# Patient Record
Sex: Female | Born: 1995 | Race: White | Hispanic: No | Marital: Single | Smoking: Never smoker
Health system: Southern US, Community
[De-identification: ages and names within clinical notes are randomized; demographics above are authoritative.]

## PROBLEM LIST (undated history)

## (undated) DIAGNOSIS — E109 Type 1 diabetes mellitus without complications: Secondary | ICD-10-CM

## (undated) DIAGNOSIS — E119 Type 2 diabetes mellitus without complications: Secondary | ICD-10-CM

---

## 2017-08-05 ENCOUNTER — Emergency Department (HOSPITAL_COMMUNITY): Payer: BLUE CROSS/BLUE SHIELD

## 2017-08-05 ENCOUNTER — Emergency Department (HOSPITAL_COMMUNITY)
Admission: EM | Admit: 2017-08-05 | Discharge: 2017-08-05 | Disposition: A | Discharge status: Home / Self Care | Payer: BLUE CROSS/BLUE SHIELD | Attending: Emergency Medicine | Admitting: Emergency Medicine

## 2017-08-05 ENCOUNTER — Other Ambulatory Visit: Payer: Self-pay

## 2017-08-05 ENCOUNTER — Encounter (HOSPITAL_COMMUNITY): Payer: Self-pay

## 2017-08-05 DIAGNOSIS — R569 Unspecified convulsions: Secondary | ICD-10-CM

## 2017-08-05 DIAGNOSIS — R059 Cough, unspecified: Secondary | ICD-10-CM

## 2017-08-05 DIAGNOSIS — R809 Proteinuria, unspecified: Secondary | ICD-10-CM | POA: Insufficient documentation

## 2017-08-05 DIAGNOSIS — R05 Cough: Secondary | ICD-10-CM

## 2017-08-05 DIAGNOSIS — R739 Hyperglycemia, unspecified: Secondary | ICD-10-CM | POA: Diagnosis not present

## 2017-08-05 HISTORY — DX: Type 2 diabetes mellitus without complications: E11.9

## 2017-08-05 HISTORY — DX: Type 1 diabetes mellitus without complications: E10.9

## 2017-08-05 LAB — I-STAT BETA HCG BLOOD, ED (MC, WL, AP ONLY): I-stat hCG, quantitative: <5 m[IU]/mL (ref <5)

## 2017-08-05 LAB — COMPREHENSIVE METABOLIC PANEL
ALK PHOS: 64 U/L (ref 38–126)
ALT: 21 U/L (ref 14–54)
AST: 39 U/L (ref 15–41)
Albumin: 3.6 g/dL (ref 3.5–5.0)
Anion gap: 15 (ref 5–15)
BILIRUBIN TOTAL: 0.6 mg/dL (ref 0.3–1.2)
BUN: 10 mg/dL (ref 6–20)
CHLORIDE: 104 mmol/L (ref 101–111)
CO2: 17 mmol/L — ABNORMAL LOW (ref 22–32)
Calcium: 8.8 mg/dL — ABNORMAL LOW (ref 8.9–10.3)
Creatinine, Ser: 0.89 mg/dL (ref 0.44–1.00)
GFR calc Af Amer: >60 mL/min (ref >60)
Glucose, Bld: 469 mg/dL — ABNORMAL HIGH (ref 65–99)
Potassium: 4.6 mmol/L (ref 3.5–5.1)
Sodium: 136 mmol/L (ref 135–145)
TOTAL PROTEIN: 6.9 g/dL (ref 6.5–8.1)

## 2017-08-05 LAB — CBC WITH DIFFERENTIAL/PLATELET
Basophils Absolute: 0 10*3/uL (ref 0.0–0.1)
Basophils Relative: 0 %
Eosinophils Absolute: 0 10*3/uL (ref 0.0–0.7)
Eosinophils Relative: 1 %
HCT: 39.9 % (ref 36.0–46.0)
Hemoglobin: 13.3 g/dL (ref 12.0–15.0)
LYMPHS ABS: 1.1 10*3/uL (ref 0.7–4.0)
LYMPHS PCT: 21 %
MCH: 28.7 pg (ref 26.0–34.0)
MCHC: 33.3 g/dL (ref 30.0–36.0)
MCV: 86 fL (ref 78.0–100.0)
Monocytes Absolute: 0.1 10*3/uL (ref 0.1–1.0)
Monocytes Relative: 2 %
NEUTROS PCT: 76 %
Neutro Abs: 3.7 10*3/uL (ref 1.7–7.7)
Platelets: 177 10*3/uL (ref 150–400)
RBC: 4.64 MIL/uL (ref 3.87–5.11)
RDW: 12.3 % (ref 11.5–15.5)
WBC: 4.9 10*3/uL (ref 4.0–10.5)

## 2017-08-05 LAB — URINALYSIS, ROUTINE W REFLEX MICROSCOPIC
Bacteria, UA: NONE SEEN
Bilirubin Urine: NEGATIVE
Glucose, UA: >=500 mg/dL — AB
KETONES UR: 20 mg/dL — AB
Leukocytes, UA: NEGATIVE
Nitrite: NEGATIVE
PH: 5 (ref 5.0–8.0)
Protein, ur: NEGATIVE mg/dL
Specific Gravity, Urine: 1.021 (ref 1.005–1.030)

## 2017-08-05 LAB — I-STAT CHEM 8, ED
BUN: 11 mg/dL (ref 6–20)
CHLORIDE: 104 mmol/L (ref 101–111)
CREATININE: 0.6 mg/dL (ref 0.44–1.00)
Calcium, Ion: 1.15 mmol/L (ref 1.15–1.40)
Glucose, Bld: 460 mg/dL — ABNORMAL HIGH (ref 65–99)
HEMATOCRIT: 41 % (ref 36.0–46.0)
Hemoglobin: 13.9 g/dL (ref 12.0–15.0)
POTASSIUM: 4.6 mmol/L (ref 3.5–5.1)
Sodium: 138 mmol/L (ref 135–145)
TCO2: 18 mmol/L — ABNORMAL LOW (ref 22–32)

## 2017-08-05 LAB — RAPID URINE DRUG SCREEN, HOSP PERFORMED
Amphetamines: NOT DETECTED
BENZODIAZEPINES: POSITIVE — AB
Barbiturates: NOT DETECTED
Cocaine: NOT DETECTED
Opiates: NOT DETECTED
Tetrahydrocannabinol: NOT DETECTED

## 2017-08-05 LAB — MAGNESIUM: MAGNESIUM: 1.7 mg/dL (ref 1.7–2.4)

## 2017-08-05 LAB — CBG MONITORING, ED: Glucose-Capillary: 317 mg/dL — ABNORMAL HIGH (ref 65–99)

## 2017-08-05 LAB — ETHANOL: Alcohol, Ethyl (B): <10 mg/dL (ref <10)

## 2017-08-05 MED ORDER — SODIUM CHLORIDE 0.9 % IV BOLUS (SEPSIS)
1000.0000 mL | Freq: Once | INTRAVENOUS | Status: AC
  Administered 2017-08-05: 1000 mL via INTRAVENOUS

## 2017-08-05 MED ORDER — SODIUM CHLORIDE 0.9 % IV SOLN: INTRAVENOUS | Status: DC

## 2017-08-05 NOTE — ED Triage Notes (Signed)
[facility]; pt had a seizure at approx this am 1045 (no Hx of seizure activity, pt is a type 1 diabetic, CBG 410); pt seized again at approx 1150; pta pt recieved 500 NS, 2.5 mg midazolam; Pt A&O to person and place at this time

## 2017-08-05 NOTE — ED Notes (Signed)
ED Provider at bedside. 

## 2017-08-05 NOTE — ED Notes (Signed)
Family at bedside. 

## 2017-08-05 NOTE — Discharge Instructions (Signed)
Your work up was negative for an acute abnormality today.  Please follow-up with a neurologist.  Her urine showed some protein today and you will need a repeat urinalysis in about 1 week. Do not drive, swim, take baths or do any other activities that would be dangerous if you had another seizure!  Get help right away if: You have another seizure: That lasts longer than 5 minutes. That is different than previous seizures. That leaves you unable to speak or use a part of your body. That makes it harder to breathe. After a head injury. You have: Multiple seizures in a row. Confusion or a severe headache right after a seizure. You are having seizures more often. You do not wake up immediately after a seizure. You injure yourself during a seizure.  Department of Motor Vehicle [facility](DMV) of [place] regulations for seizures - It is the patient?s responsibility to report the incidence of the seizure in the state of [place]. [place] has no statutory provision requiring physicians to report patients diagnosed with epilepsy or seizures to a central state agency.  The recommended DMV regulation requirement for a driver in [place] for an individual with a seizure is that they be seizure-free for 6-12 months. However, the DMV may consider the following exceptions to this general rule where: (1) a physician-directed change in medication causes a seizure and the individual immediately resumes the previous therapy which controlled seizures; (2) there is a history of nocturnal seizures or seizures which do not involve loss of consciousness, loss of control of motor function, or loss of appropriate sensation and information process; and (3) an individual has a seizure disorder preceded by an aura (warning) lasting 2-3 minutes. While the [facility] may also give consideration to other unusual circumstances which may affect the general requirement that drivers be seizure-free for 6-12 months, interpretation of these circumstances and  assignment of restrictions is at the discretion of the Medical Advisor. The DMV also considers compliance with medical therapy essential for safe driving. [facility][The [place] Physician's Guide to [employer] Medical Evaluation (June, 1995 ed.)] The Department learns of an individual's condition by inquiring on the application form or renewal form, a physician's report to the [facility], an accident report or from correspondence from the individual. The person may be required to submit a Medical Report Form either annually or semi-annually.    Contact a health care provider if: You have another seizure. You have seizures more often. Your seizure symptoms change. You continue to have seizures with treatment. You have symptoms of an infection or illness. They might increase your risk of having a seizure. Get help right away if: You have a seizure: That lasts longer than 5 minutes. That is different than previous seizures. That leaves you unable to speak or use a part of your body. That makes it harder to breathe. After a head injury. You have: Multiple seizures in a row. Confusion or a severe headache right after a seizure. You are having seizures more often. You do not wake up immediately after a seizure. You injure yourself during a seizure.

## 2017-08-05 NOTE — ED Provider Notes (Signed)
[facility] Provider Note   CSN: [id number] Arrival date & time: 08/05/17  1156     History   Chief Complaint Chief Complaint  Patient presents with  . Seizures   Level 5 caveat due to altered mental status HPI Cassandra Grant is a 22 y.o. female who arrives by EMS for new onset seizure.  She is a type I diabetic and took her regular medications today.  Patient had onset of tonic-clonic seizure around 10:45 AM.  Seizure lasted approximately 1 minute.  She has no previous history of seizures.  EMS transported the patient to the emergency department and the patient had a second 45-second seizure in the ambulance bay with urinary incontinence.  Patient is still mildly icteric but able to answer questions appropriately.  She denies any illicit drug use, changes in medications, injuries to her mouth, alcohol abuse.  Of note her blood sugar was found to be greater than 400 by EMS today. HPI  History reviewed. No pertinent past medical history.  There are no active problems to display for this patient.   History reviewed. No pertinent surgical history.  OB History    No data available       Home Medications    Prior to Admission medications   Medication Sig Start Date End Date Taking? Authorizing Provider  HUMALOG 100 UNIT/ML cartridge 0-50 07/07/17  Yes [provider]  SRONYX 0.1-20 MG-MCG tablet Take 1 tablet by mouth daily. 08/04/17  Yes [provider]  TOUJEO SOLOSTAR 300 UNIT/ML SOPN Inject 24 Units into the skin at bedtime. 08/01/17  Yes [provider]    Family History No family history on file.  Social History Social History   Tobacco Use  . Smoking status: Never Smoker  . Smokeless tobacco: Never Used  Substance Use Topics  . Alcohol use: No    Frequency: Never  . Drug use: No     Allergies   Patient has no allergy information on record.   Review of Systems Review of Systems  Ten systems  reviewed and are negative for acute change, except as noted in the HPI.   Physical Exam Updated Vital Signs BP 129/90   Pulse 100   Resp 18   SpO2 99%   Physical Exam  Constitutional: She appears well-developed and well-nourished. No distress.  HENT:  Head: Normocephalic and atraumatic.  No injuries to the tongue or mouth  Eyes: Conjunctivae are normal. No scleral icterus.  Pupils equal but sluggish  Neck: Normal range of motion.  Cardiovascular: Regular rhythm and normal heart sounds. Exam reveals no gallop and no friction rub.  No murmur heard. Tachycardic  Pulmonary/Chest: Effort normal and breath sounds normal. No respiratory distress.  Abdominal: Soft. Bowel sounds are normal. She exhibits no distension and no mass. There is no tenderness. There is no guarding.  Genitourinary:  Genitourinary Comments: Incontinent of urine  Neurological: She is alert.  Patient currently disoriented  Skin: Skin is warm and dry. She is not diaphoretic.  Nursing note and vitals reviewed.    ED Treatments / Results  Labs (all labs ordered are listed, but only abnormal results are displayed) Labs Reviewed - No data to display  EKG  EKG Interpretation None       Radiology Ct Head Wo Contrast  Result Date: 08/05/2017 CLINICAL DATA:  Grand mal seizure today EXAM: CT HEAD WITHOUT CONTRAST TECHNIQUE: Contiguous axial images were obtained from the base of the skull through the vertex  without intravenous contrast. COMPARISON:  None. FINDINGS: Brain: The brainstem, cerebellum, cerebral peduncles, thalami, basal ganglia, basilar cisterns, and ventricular system appear within normal limits. No intracranial hemorrhage, mass lesion, or acute CVA. Vascular: Unremarkable Skull: Unremarkable Sinuses/Orbits: Unremarkable Other: No supplemental non-categorized findings. IMPRESSION: 1. No significant intracranial abnormality is identified on today's CT examination. Electronically Signed   By: Gaylyn Rong M.D.   On: 08/05/2017 13:25   Dg Chest Port 1 View  Result Date: 08/05/2017 CLINICAL DATA:  Pt passed out in class x this am (pt doesn't remember what happened), diabetic, nonsmoker, no other symptoms, pt shielded EXAM: PORTABLE CHEST 1 VIEW COMPARISON:  None. FINDINGS: Normal mediastinum and cardiac silhouette. Normal pulmonary vasculature. No evidence of effusion, infiltrate, or pneumothorax. No acute bony abnormality. IMPRESSION: No acute cardiopulmonary process. Electronically Signed   By: Genevive Bi M.D.   On: 08/05/2017 12:40    Procedures Procedures (including critical care time)  Medications Ordered in ED Medications - No data to display   Initial Impression / Assessment and Plan / ED Course  I have reviewed the triage vital signs and the nursing notes.  Pertinent labs & imaging results that were available during my care of the patient were reviewed by me and considered in my medical decision making (see chart for details).  Clinical Course as of Aug 06 1555  Tue Aug 05, 2017  1305 Pulse Rate: (!) 122 [AH]  1305 Resp: (!) 23 [AH]  1305 TCO2: (!) 18 [AH]  1305 Patient tachycardic with increased respiratory rate, low bicarb, and a calculated anion gap of 16.  Awaiting further lab evaluation.  She is receiving IV fluids. ? DKA  Patient is now awake and alert. Glucose: (!) 460 [AH]    Clinical Course User Index [AH] Arthor Captain, PA-C    Recent with new onset seizure.  I discussed driving precautions with the patient.  She has had no repeat seizures today.  She is back to baseline.  Her workup shows some mild proteinuria, mild ketones in the urine without evidence of DKA or anion gap.  Patient's blood sugars have decreased with fluid rehydration and she will be sent home to take her normal insulin doses.  She appears appropriate for discharge at this time.  Imaging reviewed without abnormalities.  Final Clinical Impressions(s) / ED Diagnoses   Final  diagnoses:  Seizure (HCC)  Hyperglycemia  Proteinuria, unspecified type    ED Discharge Orders    None       Arthor Captain, PA-C 08/05/17 1600    Jacalyn Lefevre, MD 08/05/17 1622

## 2017-08-14 ENCOUNTER — Other Ambulatory Visit: Payer: Self-pay | Admitting: Neurology

## 2017-08-14 DIAGNOSIS — R569 Unspecified convulsions: Secondary | ICD-10-CM

## 2017-08-19 ENCOUNTER — Ambulatory Visit (HOSPITAL_COMMUNITY)
Admission: RE | Admit: 2017-08-19 | Discharge: 2017-08-19 | Disposition: A | Discharge status: Home / Self Care | Payer: BLUE CROSS/BLUE SHIELD | Source: Ambulatory Visit | Attending: Neurology | Admitting: Neurology

## 2017-08-19 DIAGNOSIS — R9401 Abnormal electroencephalogram [EEG]: Secondary | ICD-10-CM | POA: Insufficient documentation

## 2017-08-19 DIAGNOSIS — R569 Unspecified convulsions: Secondary | ICD-10-CM | POA: Insufficient documentation

## 2017-08-19 NOTE — Progress Notes (Signed)
EEG completed, results pending. 

## 2017-08-19 NOTE — Procedures (Signed)
ELECTROENCEPHALOGRAM REPORT  Date of Study: 08/19/2017  Patient's Name: Cassandra Grant MRN: [id number] Date of Birth: April 11, 1996  Referring Provider: Dr. Oleh Geninlukayode Onasanya  Clinical History: This is a 22 year old woman with a history of absence seizures, recent tonic-clonic seizure.  Medications: Humalog Sronyx Toujeo  Technical Summary: A multichannel digital EEG recording measured by the international 10-20 system with electrodes applied with paste and impedances below 5000 ohms performed in our laboratory with EKG monitoring in an awake and drowsy patient.  Hyperventilation and photic stimulation were performed.  The digital EEG was referentially recorded, reformatted, and digitally filtered in a variety of bipolar and referential montages for optimal display.    Description: The patient is awake and drowsy during the recording.  During maximal wakefulness, there is a symmetric, medium voltage 10.5-11 Hz posterior dominant rhythm that attenuates with eye opening.  The record is symmetric.  During drowsiness, there is an increase in theta slowing of the background.  Deeper stages of sleep were not captured. Hyperventilation and photic stimulation did not elicit any abnormalities.  There were occasional 1-3 second bursts of generalized high voltage 3-4 Hz spike and polyspike and wave discharges with frontal predominance seen. No clinical events reported. There were no electrographic seizures seen.    EKG lead was unremarkable.  Impression: This awake and drowsy EEG is abnormal due to occasional bursts of generalized high voltage 3-4 Hz spike and polyspike and wave discharges with frontal predominance.  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 Clinical correlation is advised.   Cassandra Grant, M.D.

## 2019-12-31 IMAGING — DX DG CHEST 1V PORT
1 series · 1 of 1 positions shown · non-contrast
Comparison: None.

CLINICAL DATA: Pt passed out in class x this am (pt doesn't
remember what happened), diabetic, nonsmoker, no other symptoms, pt
shielded

EXAM:
PORTABLE CHEST 1 VIEW

[chest ap]
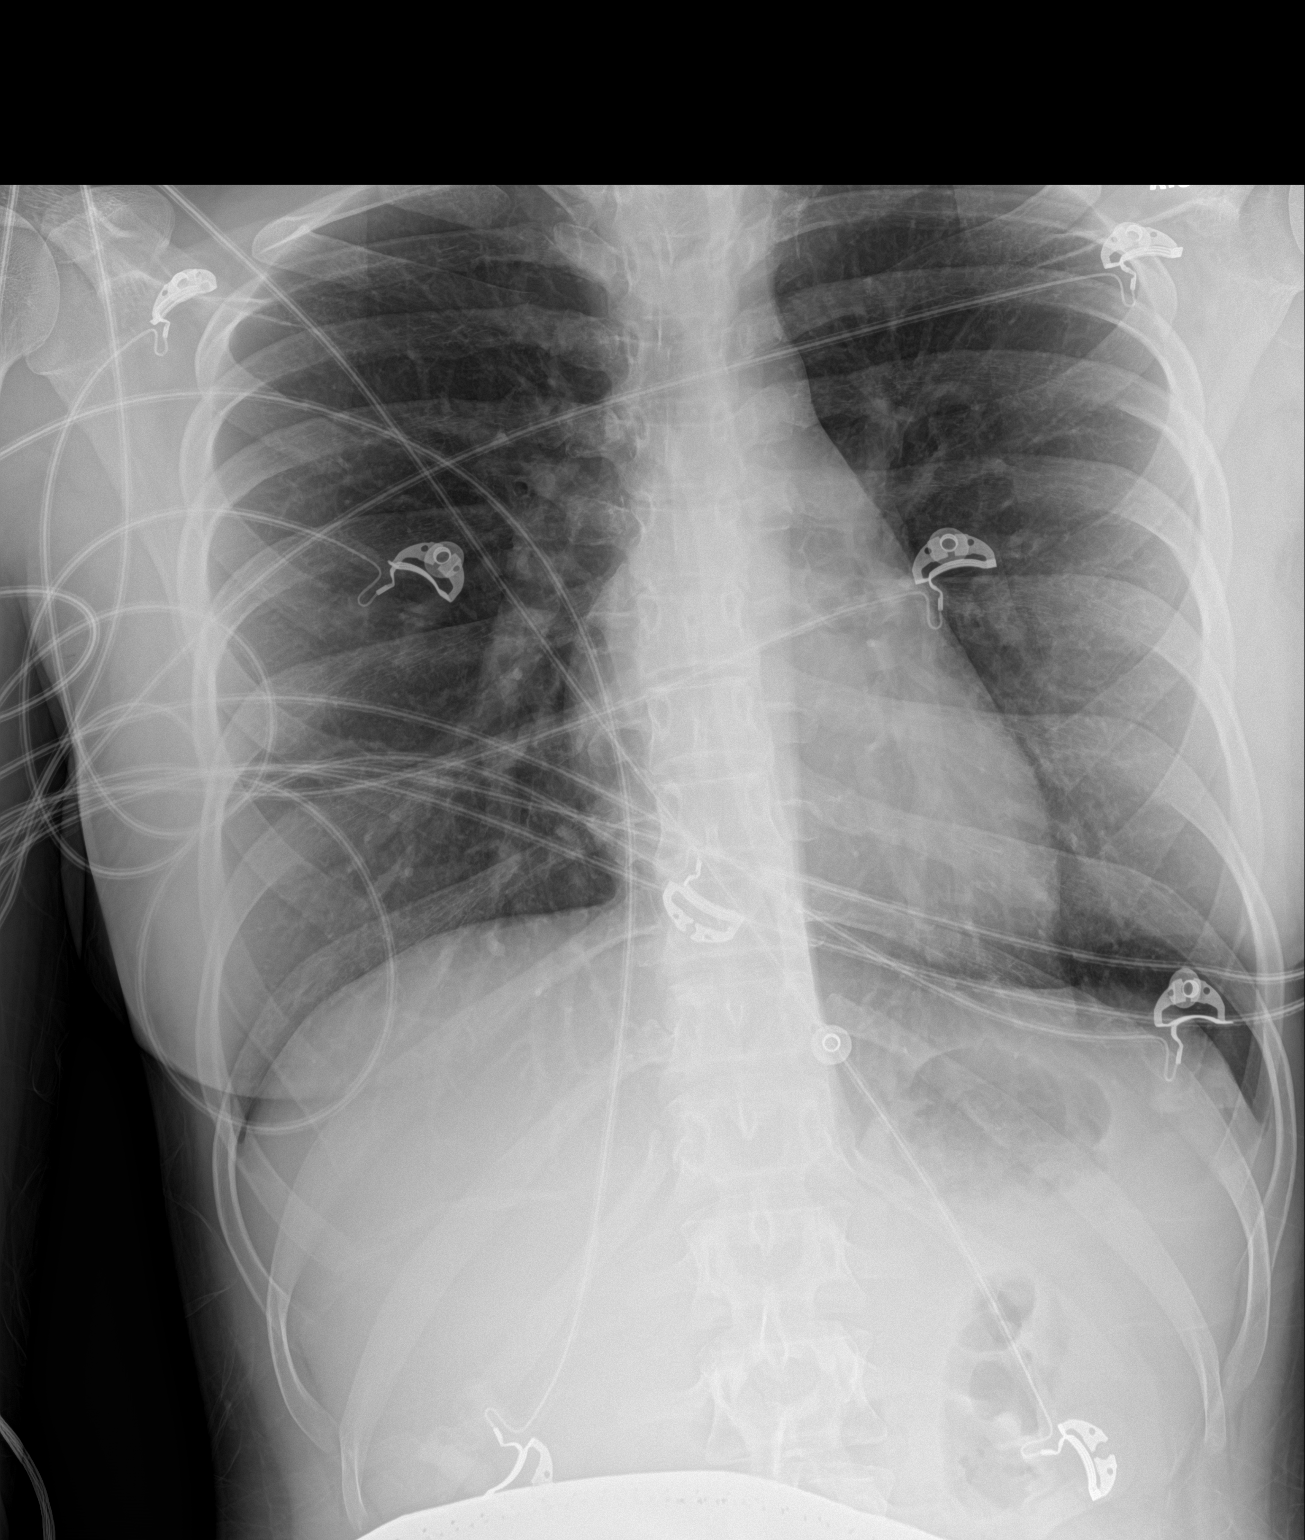

[1 of 1 positions shown; findings below may reference images not displayed]

FINDINGS: Normal mediastinum and cardiac silhouette. Normal pulmonary
vasculature. No evidence of effusion, infiltrate, or pneumothorax.
No acute bony abnormality.
IMPRESSION: No acute cardiopulmonary process.
